# Patient Record
Sex: Female | Born: 1945 | Race: Black or African American | Hispanic: No | Marital: Married | Smoking: Never smoker
Health system: Southern US, Community
[De-identification: ages and names within clinical notes are randomized; demographics above are authoritative.]

## PROBLEM LIST (undated history)

## (undated) DIAGNOSIS — K219 Gastro-esophageal reflux disease without esophagitis: Secondary | ICD-10-CM

## (undated) DIAGNOSIS — E785 Hyperlipidemia, unspecified: Secondary | ICD-10-CM

## (undated) DIAGNOSIS — R5383 Other fatigue: Secondary | ICD-10-CM

## (undated) DIAGNOSIS — D72820 Lymphocytosis (symptomatic): Secondary | ICD-10-CM

## (undated) DIAGNOSIS — I8390 Asymptomatic varicose veins of unspecified lower extremity: Secondary | ICD-10-CM

## (undated) DIAGNOSIS — M542 Cervicalgia: Secondary | ICD-10-CM

## (undated) DIAGNOSIS — M1990 Unspecified osteoarthritis, unspecified site: Secondary | ICD-10-CM

## (undated) HISTORY — DX: Cervicalgia: M54.2

## (undated) HISTORY — DX: Unspecified osteoarthritis, unspecified site: M19.90

## (undated) HISTORY — DX: Other fatigue: R53.83

## (undated) HISTORY — DX: Gastro-esophageal reflux disease without esophagitis: K21.9

## (undated) HISTORY — PX: EYE SURGERY: SHX253

## (undated) HISTORY — PX: ELBOW SURGERY: SHX618

## (undated) HISTORY — PX: TONSILLECTOMY: SUR1361

## (undated) HISTORY — DX: Hyperlipidemia, unspecified: E78.5

## (undated) HISTORY — PX: ABDOMINAL HYSTERECTOMY: SHX81

## (undated) HISTORY — DX: Lymphocytosis (symptomatic): D72.820

## (undated) HISTORY — PX: CARPAL TUNNEL RELEASE: SHX101

## (undated) HISTORY — DX: Asymptomatic varicose veins of unspecified lower extremity: I83.90

---

## 2015-08-10 ENCOUNTER — Ambulatory Visit (INDEPENDENT_AMBULATORY_CARE_PROVIDER_SITE_OTHER): Payer: Medicare Other | Admitting: Neurology

## 2015-08-10 ENCOUNTER — Encounter: Payer: Self-pay | Admitting: Neurology

## 2015-08-10 VITALS — BP 156/71 | HR 68 | Ht 62.0 in | Wt 156.0 lb

## 2015-08-10 DIAGNOSIS — M542 Cervicalgia: Secondary | ICD-10-CM | POA: Diagnosis not present

## 2015-08-10 DIAGNOSIS — R251 Tremor, unspecified: Secondary | ICD-10-CM

## 2015-08-10 DIAGNOSIS — R202 Paresthesia of skin: Secondary | ICD-10-CM | POA: Diagnosis not present

## 2015-08-10 MED ORDER — GABAPENTIN 100 MG PO CAPS: 100.0000 mg | ORAL_CAPSULE | Freq: Three times a day (TID) | ORAL | Status: DC

## 2015-08-10 NOTE — Progress Notes (Signed)
PATIENT: Sarah Rowe DOB: [birth year]  Chief Complaint  Patient presents with  . Neck Pain    She is having pain and tingling in her neck that radiates into her right scapula, arm and hand.  She has tried muscle relaxers, gabapentin, narocotics and NSAIDS without much relief.  She remembers having an abnormal MRI several years ago that showed a "swollen disc pressing on a nerve".     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  In 2013, she also noticed gradual onset right-sided neck pain, radiating numbness tingling to right shoulder, right scapular region, also to her right arm, to her lateral 3 fingers, she denies significant weakness, symptoms gradually worsened over the past 3 years, she denies gait difficulty.  She had MRI of cervical spine December 2013, subtle spondylosis with very mild degenerative disc disease from C3-4 through C5-6, no disc herniation no canal stenosis or cord compression  She denied loss sense of smell, no family history of tremor, she denies REM sleep disorder, no vivid dreams, she denies orthostatic symptoms, she denies constipations. No gait difficulties.  REVIEW OF SYSTEMS: Full 14 system review of systems performed and notable only for as above  ALLERGIES: Allergies  Allergen Reactions  . Penicillins Rash    HOME MEDICATIONS: Current Outpatient Prescriptions  Medication Sig Dispense Refill  . cyclobenzaprine (FLEXERIL) 5 MG tablet Take 5 mg by mouth.    . Glucosamine-MSM-Hyaluronic Acd (JOINT HEALTH PO) Take  by mouth.    . Homeopathic Products ([employer] NERVE PAIN RELIEF EX) Apply topically.    Marland Kitchen ibuprofen (ADVIL) 200 MG tablet Take  1 or 2 every 6 hrs as  Needed for pain    . meloxicam (MOBIC) 7.5 MG tablet Take 7.5 mg by mouth daily.    . Multiple Vitamin (MULTI-VITAMINS) TABS Take by mouth.    . pyridOXINE (VITAMIN B-6) 100 MG tablet Take 100 mg by mouth daily.    Marland Kitchen UNABLE TO FIND Med Name: Cataplex B    . UNABLE TO FIND Med Name: [facility] Choice Nerve Pain Formula Supplement     No current facility-administered medications for this visit.    PAST MEDICAL HISTORY: Past Medical History  Diagnosis Date  . Neck pain   . Varicose veins   . Osteoarthritis   . GERD (gastroesophageal reflux disease)   . Hyperlipemia   . Lymphocytosis   . Fatigue     PAST SURGICAL HISTORY: Past Surgical History  Procedure Laterality Date  . Carpal tunnel release Bilateral   . Abdominal hysterectomy    . Elbow surgery Right   . Tonsillectomy      FAMILY HISTORY: Family History  Problem Relation Age of Onset  . Lung cancer Mother   . Heart disease Father   . Heart attack Father     SOCIAL HISTORY:  Social History   Social History  . Marital Status: Married    Spouse Name: N/A  . Number of Children: 2  . Years of Education: 11   Occupational History  . Retired    Social History Main Topics  .  Smoking status: Never Smoker   . Smokeless tobacco: Not on file  . Alcohol Use: No  . Drug Use: No  . Sexual Activity: Not on file   Other Topics Concern  . Not on file   Social History Narrative   Lives at home with her husband.   Right-handed.   No caffeine use.     PHYSICAL EXAM   Filed Vitals:   08/10/15 1027  BP: 156/71  Pulse: 68  Height: 5\' 2"  (1.575 m)  Weight: 156 lb (70.761 kg)    Not recorded      Body mass index is 28.53 kg/(m^2).  PHYSICAL EXAMNIATION:  Gen: NAD, conversant, well nourised, obese, well groomed                     Cardiovascular: Regular rate  rhythm, no peripheral edema, warm, nontender. Eyes: Conjunctivae clear without exudates or hemorrhage Neck: Supple, no carotid bruise. Pulmonary: Clear to auscultation bilaterally   NEUROLOGICAL EXAM:  MENTAL STATUS: Speech:    Speech is normal; fluent and spontaneous with normal comprehension.  Cognition:     Orientation to time, place and person     Normal recent and remote memory     Normal Attention span and concentration     Normal Language, naming, repeating,spontaneous speech     Fund of knowledge   CRANIAL NERVES: CN II: Visual fields are full to confrontation. Fundoscopic exam is normal with sharp discs and no vascular changes. Pupils are round equal and briskly reactive to light. CN III, IV, VI: extraocular movement are normal. No ptosis. CN V: Facial sensation is intact to pinprick in all 3 divisions bilaterally. Corneal responses are intact.  CN VII: Face is symmetric with normal eye closure and smile. CN VIII: Hearing is normal to rubbing fingers CN IX, X: Palate elevates symmetrically. Phonation is normal. CN XI: Head turning and shoulder shrug are intact CN XII: Tongue is midline with normal movements and no atrophy.  MOTOR: She has bilateral hand resting tremor, right worse than left, mild rigidity increased with reinforcement maneuver, right worse than left, also has mild no rigidity, mild bilateral upper extremity bradykinesia, no weakness  REFLEXES: Reflexes are 2+ and symmetric at the biceps, triceps, knees, and ankles. Plantar responses are flexor.  SENSORY: Intact to light touch, pinprick, position sense, and vibration sense are intact in fingers and toes.  COORDINATION: Rapid alternating movements and fine finger movements are intact. There is no dysmetria on finger-to-nose and heel-knee-shin.    GAIT/STANCE: Posture is normal. Gait is steady with normal steps, she has increased bilateral hands tremor, mildly decreased arm swing  bilaterally   DIAGNOSTIC DATA (LABS, IMAGING, TESTING) - I reviewed patient records, labs, notes, testing and imaging myself where available.   ASSESSMENT AND PLAN  Sarah Rowe is a 69 y.o. female   Right neck pain, radiating pain to right shoulder, right upper extremity  Need to rule out right cervical radiculopathy  MRI of cervical  EMG nerve conduction study  Gabapentin 100 mg 3 times a day, when necessary mobics  Bilateral hands tremor, mild parkinsonian features  Continue to observe her symptoms    Levert Feinstein, M.D. Ph.D.  [facility] [address] [place], [place] [address] Ph: [phone] Fax: [phone]  CC: Estell Harpin, Olegario Messier

## 2015-08-24 ENCOUNTER — Ambulatory Visit
Admission: RE | Admit: 2015-08-24 | Discharge: 2015-08-24 | Disposition: A | Discharge status: Home / Self Care | Payer: Medicare Other | Source: Ambulatory Visit | Attending: Neurology | Admitting: Neurology

## 2015-08-24 DIAGNOSIS — M542 Cervicalgia: Secondary | ICD-10-CM | POA: Diagnosis not present

## 2015-08-24 DIAGNOSIS — R251 Tremor, unspecified: Secondary | ICD-10-CM

## 2015-08-24 DIAGNOSIS — R202 Paresthesia of skin: Secondary | ICD-10-CM

## 2015-08-26 ENCOUNTER — Telehealth: Payer: Self-pay | Admitting: Neurology

## 2015-08-26 NOTE — Telephone Encounter (Signed)
Please call patient, MRI of cervical spine showed mild cervical degenerative disc disease, no significant canal or foraminal stenosis.  IMPRESSION: Abnormal MRI scan of the cervical spine showing mild disc degenerative changes from C3-C6 with moderate left C5-6 foraminal narrowing but without definite compression.

## 2015-08-26 NOTE — Telephone Encounter (Signed)
LMTC./fim 

## 2015-08-29 ENCOUNTER — Telehealth: Payer: Self-pay | Admitting: Neurology

## 2015-08-29 NOTE — Telephone Encounter (Signed)
Duplicate encounter/fim 

## 2015-08-29 NOTE — Telephone Encounter (Signed)
I have spoken with Sarah Rowe and per Dr. Terrace Arabia, have advsied that mri showed mild degen. changes, no sig. canal or foraminal stenosis.  I have advised that Dr. Terrace Arabia will discuss results in greater detail at f/u appt.  She verbalized understanding of same/fim

## 2015-08-29 NOTE — Telephone Encounter (Signed)
Patient is calling back abouth the results of her MRI test.  Thanks!

## 2015-08-30 ENCOUNTER — Ambulatory Visit (INDEPENDENT_AMBULATORY_CARE_PROVIDER_SITE_OTHER): Payer: Medicare Other | Admitting: Neurology

## 2015-08-30 ENCOUNTER — Encounter (INDEPENDENT_AMBULATORY_CARE_PROVIDER_SITE_OTHER): Payer: Self-pay | Admitting: Neurology

## 2015-08-30 DIAGNOSIS — G20 Parkinson's disease: Secondary | ICD-10-CM | POA: Diagnosis not present

## 2015-08-30 DIAGNOSIS — M542 Cervicalgia: Secondary | ICD-10-CM | POA: Diagnosis not present

## 2015-08-30 DIAGNOSIS — R202 Paresthesia of skin: Secondary | ICD-10-CM | POA: Diagnosis not present

## 2015-08-30 DIAGNOSIS — Z0289 Encounter for other administrative examinations: Secondary | ICD-10-CM

## 2015-08-30 DIAGNOSIS — G20C Parkinsonism, unspecified: Secondary | ICD-10-CM

## 2015-08-30 DIAGNOSIS — R251 Tremor, unspecified: Secondary | ICD-10-CM

## 2015-08-30 MED ORDER — CARBIDOPA-LEVODOPA 25-100 MG PO TABS: 1.0000 | ORAL_TABLET | Freq: Three times a day (TID) | ORAL | Status: DC

## 2015-08-30 NOTE — Progress Notes (Signed)
PATIENT: Sarah Rowe DOB: 28-Sep-1946  No chief complaint on file.    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  In 2013, she also noticed gradual onset right-sided neck pain, radiating numbness tingling to right shoulder, right scapular region, also to her right arm, to her lateral 3 fingers, she denies significant weakness, symptoms gradually worsened over the past 3 years, she denies gait difficulty.  She had MRI of cervical spine December 2013, subtle spondylosis with very mild degenerative disc disease from C3-4 through C5-6, no disc herniation no canal stenosis or cord compression  She denied loss sense of smell, no family history of tremor, she denies REM sleep disorder, no vivid dreams, she denies orthostatic symptoms, she denies constipations. No gait difficulties.  REVIEW OF SYSTEMS: Full 14 system review of systems performed and notable only for as above  ALLERGIES: Allergies  Allergen Reactions  . Penicillins Rash    HOME MEDICATIONS: Current Outpatient Prescriptions  Medication Sig Dispense Refill  . cyclobenzaprine (FLEXERIL) 5 MG tablet Take 5 mg by mouth.    . gabapentin (NEURONTIN) 100 MG capsule Take 1 capsule (100 mg total) by mouth 3 (three) times daily. 90 capsule 6  . Glucosamine-MSM-Hyaluronic Acd (JOINT HEALTH PO) Take by mouth.    . Homeopathic Products ([employer]S NERVE PAIN RELIEF EX) Apply topically.    Marland Kitchen ibuprofen (ADVIL) 200 MG tablet Take  1 or 2 every 6 hrs as  Needed for pain    . meloxicam (MOBIC) 7.5 MG tablet  Take 7.5 mg by mouth daily.    . Multiple Vitamin (MULTI-VITAMINS) TABS Take by mouth.    . pyridOXINE (VITAMIN B-6) 100 MG tablet Take 100 mg by mouth daily.    Marland Kitchen UNABLE TO FIND Med Name: Cataplex B    . UNABLE TO FIND Med Name: [facility] Choice Nerve Pain Formula Supplement     No current facility-administered medications for this visit.    PAST MEDICAL HISTORY: Past Medical History  Diagnosis Date  . Neck pain   . Varicose veins   . Osteoarthritis   . GERD (gastroesophageal reflux disease)   . Hyperlipemia   . Lymphocytosis   . Fatigue     PAST SURGICAL HISTORY: Past Surgical History  Procedure Laterality Date  . Carpal tunnel release Bilateral   . Abdominal hysterectomy    . Elbow surgery Right   . Tonsillectomy      FAMILY HISTORY: Family History  Problem Relation Age of Onset  . Lung cancer Mother   . Heart disease Father   . Heart attack Father     SOCIAL HISTORY:  Social History   Social History  . Marital Status: Married    Spouse Name: N/A  . Number of Children: 2  . Years of Education: 11   Occupational History  . Retired    Social History Main Topics  . Smoking status: Never Smoker   . Smokeless tobacco: Not on file  . Alcohol Use: No  . Drug Use: No  . Sexual Activity: Not on file   Other Topics Concern  .  Not on file   Social History Narrative   Lives at home with her husband.   Right-handed.   No caffeine use.     PHYSICAL EXAM   There were no vitals filed for this visit.  Not recorded      There is no weight on file to calculate BMI.  PHYSICAL EXAMNIATION:  Gen: NAD, conversant, well nourised, obese, well groomed                     Cardiovascular: Regular rate rhythm, no peripheral edema, warm, nontender. Eyes: Conjunctivae clear without exudates or hemorrhage Neck: Supple, no carotid bruise. Pulmonary: Clear to auscultation bilaterally   NEUROLOGICAL EXAM:  MENTAL STATUS: Speech:    Speech is normal; fluent and  spontaneous with normal comprehension.  Cognition:     Orientation to time, place and person     Normal recent and remote memory     Normal Attention span and concentration     Normal Language, naming, repeating,spontaneous speech     Fund of knowledge   CRANIAL NERVES: CN II: Visual fields are full to confrontation. Fundoscopic exam is normal with sharp discs and no vascular changes. Pupils are round equal and briskly reactive to light. CN III, IV, VI: extraocular movement are normal. No ptosis. CN V: Facial sensation is intact to pinprick in all 3 divisions bilaterally. Corneal responses are intact.  CN VII: Face is symmetric with normal eye closure and smile. CN VIII: Hearing is normal to rubbing fingers CN IX, X: Palate elevates symmetrically. Phonation is normal. CN XI: Head turning and shoulder shrug are intact CN XII: Tongue is midline with normal movements and no atrophy.  MOTOR: She has bilateral hand resting tremor, right worse than left, mild rigidity increased with reinforcement maneuver, right worse than left, also has mild no rigidity, mild bilateral upper extremity bradykinesia, no weakness  REFLEXES: Reflexes are 2+ and symmetric at the biceps, triceps, knees, and ankles. Plantar responses are flexor.  SENSORY: Intact to light touch, pinprick, position sense, and vibration sense are intact in fingers and toes.  COORDINATION: Rapid alternating movements and fine finger movements are intact. There is no dysmetria on finger-to-nose and heel-knee-shin.    GAIT/STANCE: Posture is normal. Gait is steady with normal steps, she has increased bilateral hands tremor, mildly decreased arm swing bilaterally   DIAGNOSTIC DATA (LABS, IMAGING, TESTING) - I reviewed patient records, labs, notes, testing and imaging myself where available.   ASSESSMENT AND PLAN  [name] is a 69 y.o. female   Right neck pain, radiating pain to right shoulder, right upper  extremity  Need to rule out right cervical radiculopathy  MRI of cervical  EMG nerve conduction study  Gabapentin 100 mg 3 times a day, when necessary mobics  Bilateral hands tremor, mild parkinsonian features  Continue to observe her symptoms    Levert Feinstein, M.D. Ph.D.  [facility] [address], [address] [place], [place] [address] Ph: [phone] Fax: [phone]  CC: Estell Harpin, Olegario Messier

## 2015-08-31 NOTE — Procedures (Signed)
   NCS (NERVE CONDUCTION STUDY) WITH EMG (ELECTROMYOGRAPHY) REPORT   STUDY DATE: August 31 2015 PATIENT NAME: Sarah Rowe DOB: [birth year] MRN: [id number]    TECHNOLOGIST: Gearldine Shown ELECTROMYOGRAPHER: Levert Feinstein M.D.  CLINICAL INFORMATION:  69 year old female, complains of right shoulder pain, radiating pain to right upper extremity  FINDINGS: NERVE CONDUCTION STUDY: Bilateral ulnar sensory and motor responses were normal. Bilateral median sensory responses showed mildly prolonged peak latency, with normal snap amplitude. Bilateral median motor responses showed mildly prolonged distal latency, was normal C map amplitude, conduction velocity, and F-wave latency.   NEEDLE ELECTROMYOGRAPHY: Selected needle examinations was performed at right upper extremity muscles, right cervical paraspinal muscles.  Needle examination of right pronator teres, extensor digitorum communis, triceps, biceps, first dorsal interossei was normal.  There was no spontaneous activity at right cervical paraspinal muscles, right C5-6 and 7.   IMPRESSION:  This is an abnormal study. 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 There is no evidence of right cervical radiculopathy.   INTERPRETING PHYSICIAN:   Levert Feinstein M.D. Ph.D. [facility] [address], [address] [place], [place] [address] [phone]

## 2015-09-05 ENCOUNTER — Telehealth: Payer: Self-pay | Admitting: Neurology

## 2015-09-05 NOTE — Telephone Encounter (Signed)
Patient called to advise carbidopa-levodopa (SINEMET IR) 25-100 MG tablet "makes legs and chest pain bad and seems to make Right arm tremor at night"

## 2015-09-05 NOTE — Telephone Encounter (Signed)
She is agreeable to the instructions below and will decrease dose then titrate back up slowly.  I told her to please call us back if she has further concerns or symptoms do not resolve.

## 2015-09-05 NOTE — Telephone Encounter (Signed)
Sarah Rowe, please advise her start Sinemet 25/100 mg half tablet 3 times a day, for 1 week, then gradually increase to 1 tablet 3 times a day

## 2015-09-05 NOTE — Telephone Encounter (Signed)
She just started taking Sinemet 25-100mg  three times daily.  She has been having pain in her chest and legs since starting this medication.  Also, reports bilateral hand tremors to be worse.  Says she has not started any other medications.

## 2015-09-16 ENCOUNTER — Ambulatory Visit
Admission: RE | Admit: 2015-09-16 | Discharge: 2015-09-16 | Disposition: A | Discharge status: Home / Self Care | Payer: Medicare Other | Source: Ambulatory Visit | Attending: Neurology | Admitting: Neurology

## 2015-09-16 DIAGNOSIS — R202 Paresthesia of skin: Secondary | ICD-10-CM | POA: Diagnosis not present

## 2015-09-16 DIAGNOSIS — G20 Parkinson's disease: Secondary | ICD-10-CM | POA: Diagnosis not present

## 2015-09-16 DIAGNOSIS — R251 Tremor, unspecified: Secondary | ICD-10-CM | POA: Diagnosis not present

## 2015-09-16 DIAGNOSIS — M542 Cervicalgia: Secondary | ICD-10-CM

## 2015-09-17 ENCOUNTER — Telehealth: Payer: Self-pay | Admitting: Neurology

## 2015-09-17 NOTE — Telephone Encounter (Signed)
Please call patient, MRI brain showed age related changes, no acute lesions.  IMPRESSION: This MRI of the brain without contrast shows the following: 1. Scattered foci in the deep subcortical white matter of both hemispheres. 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 The extent is mildly more than expected for age.  2. There is no significant atrophy noted. 3. There are no acute findings.

## 2015-09-19 NOTE — Telephone Encounter (Signed)
Spoke to patient she is aware of results

## 2015-10-03 ENCOUNTER — Ambulatory Visit (INDEPENDENT_AMBULATORY_CARE_PROVIDER_SITE_OTHER): Payer: Medicare Other | Admitting: Neurology

## 2015-10-03 ENCOUNTER — Encounter: Payer: Self-pay | Admitting: Neurology

## 2015-10-03 VITALS — BP 117/74 | HR 69 | Ht 62.0 in | Wt 158.0 lb

## 2015-10-03 DIAGNOSIS — R251 Tremor, unspecified: Secondary | ICD-10-CM | POA: Diagnosis not present

## 2015-10-03 DIAGNOSIS — G20 Parkinson's disease: Secondary | ICD-10-CM | POA: Diagnosis not present

## 2015-10-03 DIAGNOSIS — M542 Cervicalgia: Secondary | ICD-10-CM | POA: Diagnosis not present

## 2015-10-03 NOTE — Progress Notes (Signed)
Chief Complaint  Patient presents with  . Neck Pain    She would like to review her MRI and EMG/NCV results.  She stopped taking gabapentin because it made her sleepy and dizzy.  She has not needed the meloxicam recently.  . Tremors    Still has a mild tremor.  She felt Sinemet made her symptoms worse and stopped the medication.      PATIENT: Sarah Rowe DOB: [birth year]  Chief Complaint  Patient presents with  . Neck Pain    She would like to review her MRI and EMG/NCV results.  She stopped taking gabapentin because it made her sleepy and dizzy.  She has not needed the meloxicam recently.  . Tremors    Still has a mild tremor.  She felt Sinemet made her symptoms worse and stopped the medication.     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  In 2013, she also noticed gradual onset right-sided neck pain, radiating numbness tingling to right shoulder, right scapular region, also to her right arm, to her lateral 3 fingers, she denies significant weakness, symptoms gradually worsened over the past 3 years, she denies gait difficulty.  She had MRI of cervical spine December 2013, subtle spondylosis with very mild degenerative disc disease from C3-4 through C5-6, no disc herniation no canal stenosis or cord compression  She denied loss sense of smell, no family history of tremor, she denies REM sleep disorder, no vivid dreams, she denies orthostatic symptoms, she denies constipations. No gait difficulties.  UPDATE Oct 03 2015: She has tried Sinemet  25/100 mg up to 1 tablet 3 times a day, she complains of arm and legs achy pain, increased tremor, has stopped taking the medications She now complains of both hand, arm tremor, she denies gait difficulty. She denies memory trouble  She still has mild midline neck pain, radiating to both shoulder,   We have personally reviewed MRI films in 2016 MRI of the brain mild small vessel disease MRI of cervical spine, multilevels degenerative disc disease, no significant canal or foraminal stenosis.   REVIEW OF SYSTEMS: Full 14 system review of systems performed and notable only for as above  ALLERGIES: Allergies  Allergen Reactions  . Penicillins Rash    HOME MEDICATIONS: Current Outpatient Prescriptions  Medication Sig Dispense Refill  . Glucosamine-MSM-Hyaluronic Acd (JOINT HEALTH PO) Take by mouth.    . Homeopathic Products ([employer] NERVE PAIN RELIEF EX) Apply topically.    Marland Kitchen ibuprofen (ADVIL) 200 MG tablet Take  1 or 2 every 6 hrs as  Needed for pain    . meloxicam (MOBIC) 7.5 MG tablet Take 7.5 mg by mouth daily.    . Multiple Vitamin (MULTI-VITAMINS) TABS Take by mouth.    . pyridOXINE (VITAMIN B-6) 100 MG tablet Take 100 mg by mouth daily.    Marland Kitchen UNABLE TO FIND Med Name: Cataplex B    . UNABLE TO FIND Med Name: [facility] Choice Nerve Pain Formula Supplement     No current facility-administered medications for this visit.    PAST MEDICAL HISTORY: Past Medical History  Diagnosis Date  . Neck pain   . Varicose veins   . Osteoarthritis   . GERD (gastroesophageal reflux disease)   . Hyperlipemia   . Lymphocytosis   . Fatigue     PAST SURGICAL HISTORY: Past Surgical History  Procedure Laterality Date  . Carpal tunnel release Bilateral   . Abdominal hysterectomy    . Elbow surgery Right   . Tonsillectomy      FAMILY HISTORY: Family History  Problem Relation Age of Onset  . Lung cancer Mother   . Heart disease Father   . Heart attack Father     SOCIAL HISTORY:  Social  History   Social History  . Marital Status: Married    Spouse Name: N/A  . Number of Children: 2  . Years of Education: 11   Occupational History  . Retired    Social History Main Topics  . Smoking status: Never Smoker   . Smokeless tobacco: Not on file  . Alcohol Use: No  . Drug Use: No  . Sexual Activity: Not on file   Other Topics Concern  . Not on file   Social History Narrative   Lives at home with her husband.   Right-handed.   No caffeine use.     PHYSICAL EXAM   Filed Vitals:   10/03/15 1059  BP: 117/74  Pulse: 69  Height:  (1.575 m)  Weight: 158 lb (71.668 kg)    Not recorded      Body mass index is 28.89 kg/(m^2).  PHYSICAL EXAMNIATION:  Gen: NAD, conversant, well nourised, obese, well groomed                     Cardiovascular: Regular rate rhythm, no peripheral edema, warm, nontender. Eyes: Conjunctivae clear without exudates or hemorrhage Neck: Supple, no carotid bruise. Pulmonary: Clear to auscultation bilaterally   NEUROLOGICAL EXAM:  MENTAL STATUS: Speech:    Speech is normal; fluent and spontaneous with normal comprehension.  Cognition:     Orientation to time, place and person     Normal recent and remote memory     Normal Attention span and concentration     Normal Language, naming, repeating,spontaneous speech     Fund of knowledge   CRANIAL NERVES: CN II: Visual fields are full to confrontation. Fundoscopic exam is normal with sharp discs and no vascular changes. Pupils are round equal and briskly reactive to light. CN III, IV, VI: extraocular movement are normal. No ptosis. CN V: Facial sensation is intact to pinprick in all 3 divisions bilaterally. Corneal responses are intact.  CN VII: Face is symmetric with normal eye closure and smile. CN VIII: Hearing is normal to rubbing fingers CN IX, X: Palate elevates symmetrically. Phonation is normal. CN XI: Head turning and shoulder shrug are intact CN XII: Tongue is midline  with normal movements and no atrophy.  MOTOR: She has bilateral hand resting tremor, right worse than left, mild rigidity increased with reinforcement maneuver, right worse than left, also has mild no rigidity, mild bilateral upper extremity bradykinesia, no weakness  REFLEXES: Reflexes are 2+ and symmetric at the biceps, triceps, knees, and ankles. Plantar responses are flexor.  SENSORY: Intact to light touch, pinprick, position sense, and vibration sense are intact in fingers and toes.  COORDINATION: Rapid alternating movements and fine finger movements are intact. There is no dysmetria on finger-to-nose and heel-knee-shin.    GAIT/STANCE: Posture is normal. Gait is steady with normal steps, she has  increased bilateral hands tremor, mildly decreased arm swing bilaterally   DIAGNOSTIC DATA (LABS, IMAGING, TESTING) - I reviewed patient records, labs, notes, testing and imaging myself where available.   ASSESSMENT AND PLAN  Lara MulchBeatrice Novinger is a 69 y.o. female   Right neck pain, radiating pain to right shoulder, right upper extremity  No evidence of active cervical radiculopathy by MRI of cervical spine, EMG nerve conduction study  Continue moderate exercise  Bilateral hands tremor, mild parkinsonian features  She could not tolerate Sinemet 25/100 mg 3 times a day, has made her symptoms worse  I have suggested her moderate exercise    Levert FeinsteinYijun Wymon Swaney, M.D. Ph.D.  [facility] [address], [address] [place], [place] [address] Ph: [phone] Fax: [phone]  CC: Estell HarpinGarner Brown, Olegario MessierKathy

## 2015-11-09 ENCOUNTER — Ambulatory Visit: Payer: Medicare Other | Admitting: Neurology

## 2016-04-05 ENCOUNTER — Ambulatory Visit: Payer: Medicare Other | Admitting: Neurology

## 2016-04-05 ENCOUNTER — Telehealth: Payer: Self-pay | Admitting: *Deleted

## 2016-04-05 NOTE — Telephone Encounter (Signed)
Called at 11:15am to cancel 11:30am appt.

## 2016-04-05 NOTE — Telephone Encounter (Signed)
Called pt to r/s appt left voice mail, for pt to call the office.

## 2016-09-11 ENCOUNTER — Other Ambulatory Visit: Payer: Self-pay | Admitting: Neurology

## 2018-05-29 ENCOUNTER — Encounter (HOSPITAL_BASED_OUTPATIENT_CLINIC_OR_DEPARTMENT_OTHER): Payer: Self-pay | Admitting: *Deleted

## 2018-05-29 ENCOUNTER — Other Ambulatory Visit: Payer: Self-pay

## 2018-05-29 ENCOUNTER — Emergency Department (HOSPITAL_BASED_OUTPATIENT_CLINIC_OR_DEPARTMENT_OTHER)
Admission: EM | Admit: 2018-05-29 | Discharge: 2018-05-29 | Disposition: A | Discharge status: Home / Self Care | Payer: Medicare HMO | Attending: Emergency Medicine | Admitting: Emergency Medicine

## 2018-05-29 DIAGNOSIS — Z79899 Other long term (current) drug therapy: Secondary | ICD-10-CM | POA: Diagnosis not present

## 2018-05-29 DIAGNOSIS — T63461A Toxic effect of venom of wasps, accidental (unintentional), initial encounter: Secondary | ICD-10-CM | POA: Insufficient documentation

## 2018-05-29 DIAGNOSIS — M79661 Pain in right lower leg: Secondary | ICD-10-CM | POA: Diagnosis present

## 2018-05-29 DIAGNOSIS — W57XXXA Bitten or stung by nonvenomous insect and other nonvenomous arthropods, initial encounter: Secondary | ICD-10-CM

## 2018-05-29 MED ORDER — ACETAMINOPHEN 500 MG PO TABS: 1000.0000 mg | ORAL_TABLET | Freq: Four times a day (QID) | ORAL | 0 refills | Status: AC | PRN

## 2018-05-29 MED ORDER — DOXYCYCLINE HYCLATE 100 MG PO CAPS: 100.0000 mg | ORAL_CAPSULE | Freq: Two times a day (BID) | ORAL | 0 refills | Status: AC

## 2018-05-29 MED ORDER — DOXYCYCLINE HYCLATE 100 MG PO TABS
100.0000 mg | ORAL_TABLET | Freq: Once | ORAL | Status: AC
  Administered 2018-05-29: 100 mg via ORAL
  Filled 2018-05-29: qty 1

## 2018-05-29 NOTE — ED Triage Notes (Signed)
Wasp sting to her right lower leg 2 days ago. Her leg is painful with redness and itching.

## 2018-05-29 NOTE — Discharge Instructions (Signed)
1.  You have been prescribed an antibiotic for an infected wasp sting.  Infection is suspected based on increased redness and swelling.  It is however also possible to get a large amount of swelling and redness from the staying alone without infection.  Keep the leg elevated on pillows and apply well wrapped ice pack for 20 minutes at a time every 2 hours for the next 2 days.  Take extra strength Tylenol every 6 hours as needed for pain. 2.  Make an appointment to see your doctor for recheck in 2 days. 3.  Return to the emergency department if you are getting worsening swelling, pain, fever or feeling generally ill.

## 2018-05-29 NOTE — ED Notes (Signed)
ED Provider at bedside. 

## 2018-05-29 NOTE — ED Provider Notes (Signed)
[facility] EMERGENCY DEPARTMENT Provider Note   CSN: [id number] Arrival date & time: 05/29/18  1952     History   Chief Complaint Chief Complaint  Patient presents with  . Insect Bite    HPI Sarah Rowe is a 72 y.o. female.  HPI Patient reports he got stung by wasp on her right lower leg 2 days ago.  Her husband reports that he did find a wasp nest right on the chair where she got stung.  Since this evening, the swelling and redness has increased.  She reports is become painful with walking.  She has not been pursuing any particular treatment.  No elevating no icing no acetaminophen.  Patient denies any associated fever, chills, constitutional symptoms. Past Medical History:  Diagnosis Date  . Fatigue   . GERD (gastroesophageal reflux disease)   . Hyperlipemia   . Lymphocytosis   . Neck pain   . Osteoarthritis   . Varicose veins     Patient Active Problem List   Diagnosis Date Noted  . Parkinsonism (HCC) 10/03/2015  . Tremor 08/30/2015  . Neck pain on right side 08/30/2015    Past Surgical History:  Procedure Laterality Date  . ABDOMINAL HYSTERECTOMY    . CARPAL TUNNEL RELEASE Bilateral   . ELBOW SURGERY Right   . TONSILLECTOMY       OB History   None      Home Medications    Prior to Admission medications   Medication Sig Start Date End Date Taking? Authorizing Provider  acetaminophen (TYLENOL) 500 MG tablet Take 2 tablets (1,000 mg total) by mouth every 6 (six) hours as needed. 05/29/18   Arby Barrette, MD  doxycycline (VIBRAMYCIN) 100 MG capsule Take 1 capsule (100 mg total) by mouth 2 (two) times daily. One po bid x 7 days 05/29/18   Arby Barrette, MD  Glucosamine-MSM-Hyaluronic Acd (JOINT HEALTH PO) Take by mouth.    [provider]  Homeopathic Products ([employer] NERVE PAIN RELIEF EX) Apply topically.    [provider]  ibuprofen (ADVIL) 200 MG tablet Take  1 or 2 every 6 hrs as  Needed for pain 03/15/15   [provider]  meloxicam (MOBIC) 7.5 MG tablet Take 7.5 mg by mouth daily.    [provider]  Multiple Vitamin (MULTI-VITAMINS) TABS Take by mouth.    [provider]  pyridOXINE (VITAMIN B-6) 100 MG tablet Take 100 mg by mouth daily.    [provider]  UNABLE TO FIND Med Name: Cataplex B    [provider]  UNABLE TO FIND Med Name: [facility] Choice Nerve Pain Formula Supplement    [provider]    Family History Family History  Problem Relation Age of Onset  . Heart disease Father   . Heart attack Father   . Lung cancer Mother     Social History Social History   Tobacco Use  . Smoking status: Never Smoker  . Smokeless tobacco: Never Used  Substance Use Topics  . Alcohol use: No    Alcohol/week: 0.0 oz  . Drug use: No     Allergies   Penicillins   Review of Systems Review of Systems  10 Systems reviewed and are negative for acute change except as noted in the HPI.  Physical Exam Updated Vital Signs BP (!) 160/65   Pulse 80   Temp 97.7 F (36.5 C) (Oral)   Resp 20   Ht 5\' 2"  (1.575 m)   Wt  64.9 kg (143 lb)   SpO2 100%   BMI 26.16 kg/m   Physical Exam  Constitutional:  Patient is alert and nontoxic.  She has evident tremor consistent with her pre-existing diagnosis of Parkinson's.  No respiratory distress.  HENT:  Head: Normocephalic and atraumatic.  No facial swelling.  Cardiovascular: Normal rate and regular rhythm.  Pulmonary/Chest: Effort normal and breath sounds normal.  Musculoskeletal:  Swelling and erythema of the lateral right lower leg.  Small central eschar consistent with a bite site.  See attached images.  Neurological: She is alert.  Patient is alert and interactive.  She has slight tremor consistent with Parkinson's.  She follows commands without difficulty.  Skin: Skin is warm and dry.           ED Treatments / Results  Labs (all labs ordered are listed, but only abnormal results  are displayed) Labs Reviewed - No data to display  EKG None  Radiology No results found.  Procedures Procedures (including critical care time)  Medications Ordered in ED Medications  doxycycline (VIBRA-TABS) tablet 100 mg (has no administration in time range)     Initial Impression / Assessment and Plan / ED Course  I have reviewed the triage vital signs and the nursing notes.  Pertinent labs & imaging results that were available during my care of the patient were reviewed by me and considered in my medical decision making (see chart for details).     Final Clinical Impressions(s) / ED Diagnoses   Final diagnoses:  Bug bite with infection, initial encounter   Patient has confirmed sting from a wasp.  Pain and erythema have increased over 2-day duration.  Will opt to treat empirically with doxycycline for potential secondary infection.  Due to large amount of inflammatory response often associated with wasp sting, patient is counseled on icing and elevating.  Return precautions reviewed. ED Discharge Orders        Ordered    doxycycline (VIBRAMYCIN) 100 MG capsule  2 times daily     05/29/18 2027    acetaminophen (TYLENOL) 500 MG tablet  Every 6 hours PRN     05/29/18 2027       Arby BarrettePfeiffer, Madicyn Mesina, MD 05/29/18 2035

## 2018-09-16 ENCOUNTER — Other Ambulatory Visit (HOSPITAL_BASED_OUTPATIENT_CLINIC_OR_DEPARTMENT_OTHER): Payer: Self-pay | Admitting: Physician Assistant

## 2018-09-16 ENCOUNTER — Ambulatory Visit (HOSPITAL_BASED_OUTPATIENT_CLINIC_OR_DEPARTMENT_OTHER)
Admission: RE | Admit: 2018-09-16 | Discharge: 2018-09-16 | Disposition: A | Discharge status: Home / Self Care | Payer: Medicare HMO | Source: Ambulatory Visit | Attending: Physician Assistant | Admitting: Physician Assistant

## 2018-09-16 DIAGNOSIS — M79604 Pain in right leg: Secondary | ICD-10-CM | POA: Diagnosis not present

## 2018-09-20 ENCOUNTER — Ambulatory Visit (HOSPITAL_BASED_OUTPATIENT_CLINIC_OR_DEPARTMENT_OTHER): Payer: Medicare HMO

## 2019-06-11 IMAGING — US US EXTREM LOW VENOUS*R*
1 series · 13 of 24 positions shown · non-contrast
Comparison: None.

CLINICAL DATA: Intermittent pain, swelling in right knee.



[Series 1: us extrem low venous*right* · 0.08mm/px · 13 of 33 slices shown]
[im 1/33]
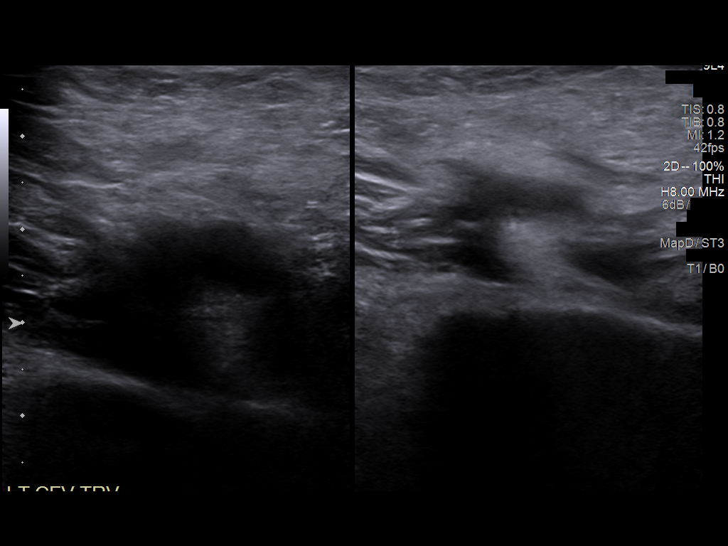
[im 3/33]
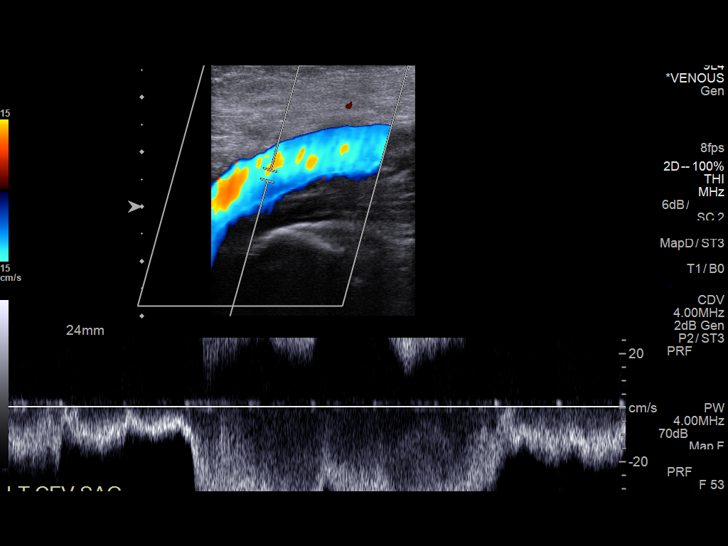
[im 6/33]
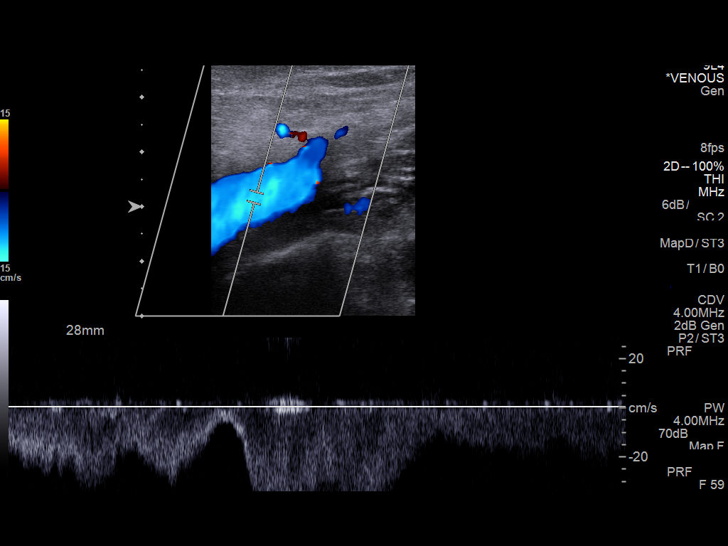
[im 9/33]
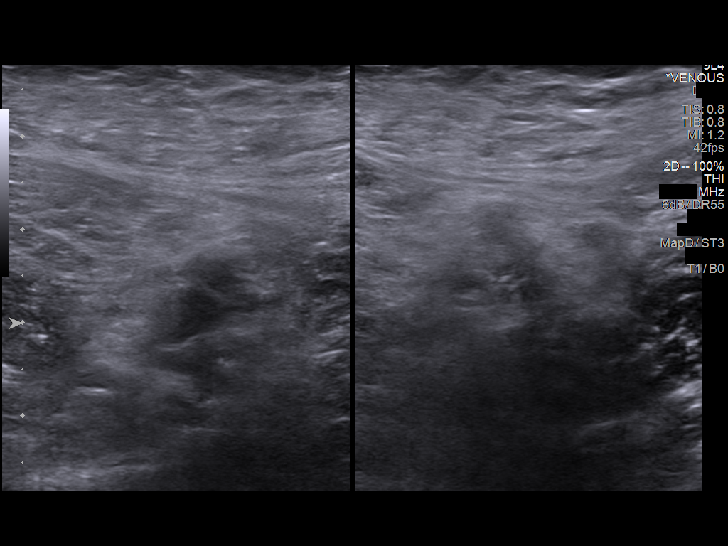
[im 12/33]
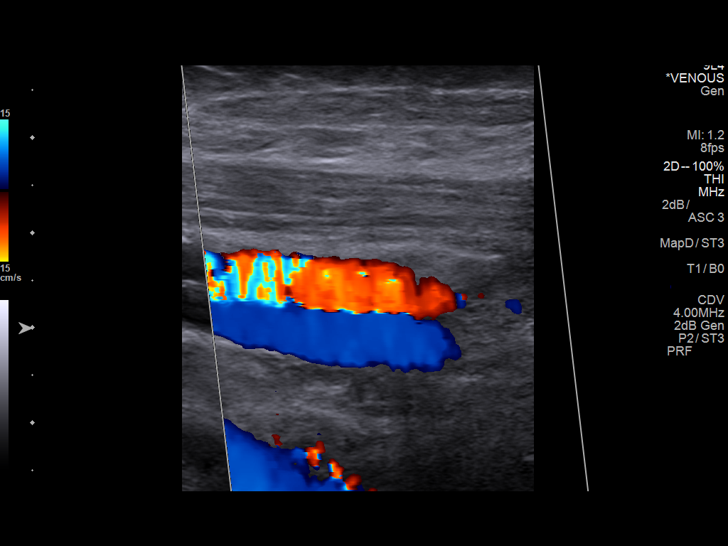
[im 14/33]
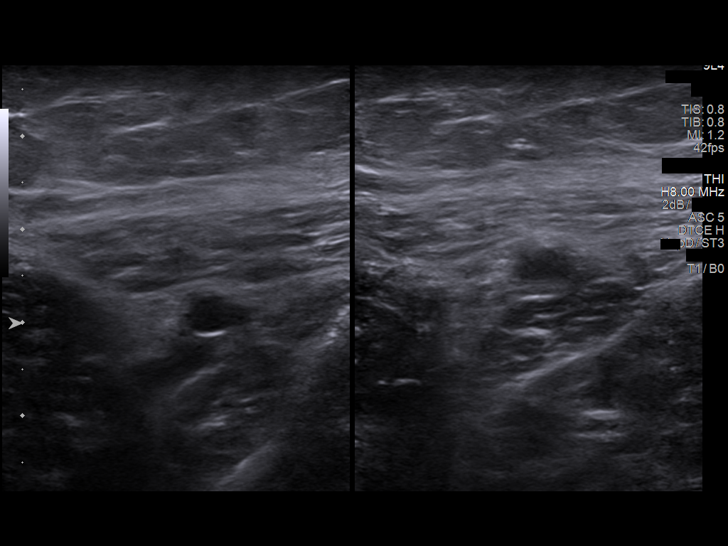
[im 17/33]
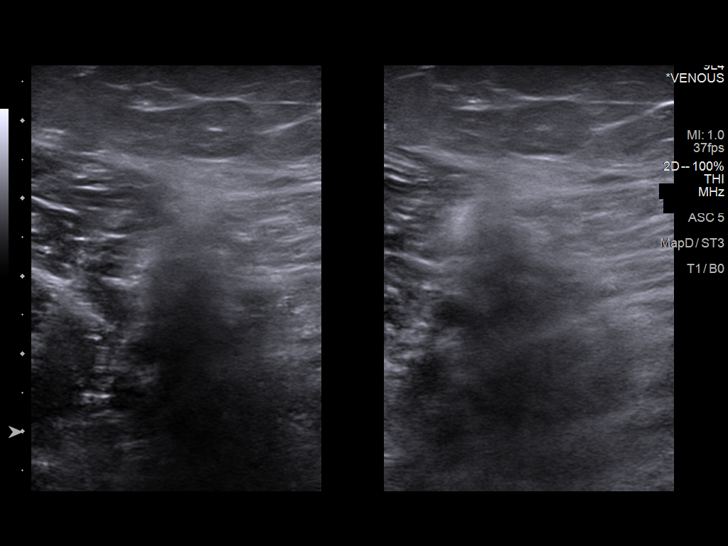
[im 19/33]
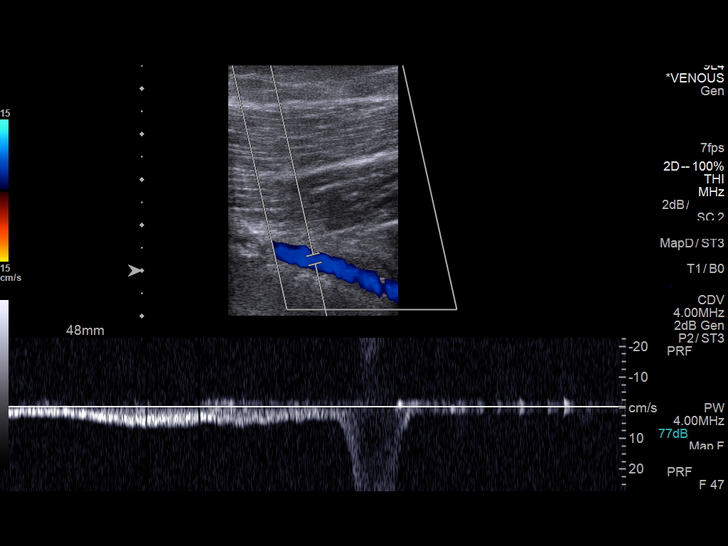
[im 21/33]
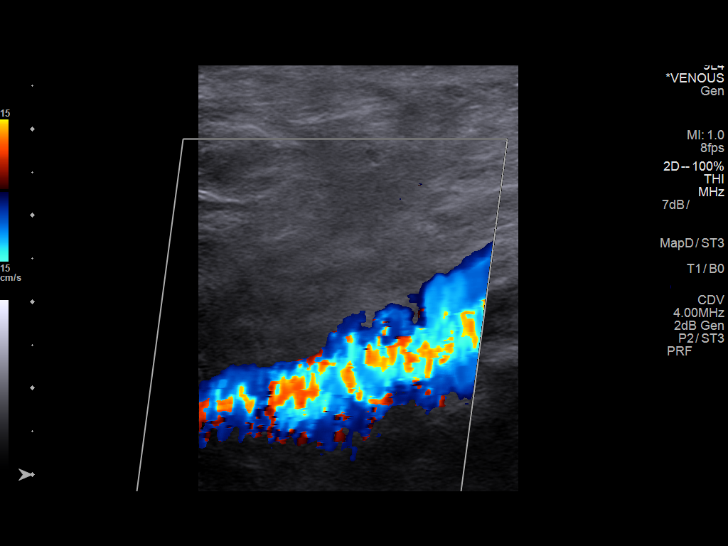
[im 24/33]
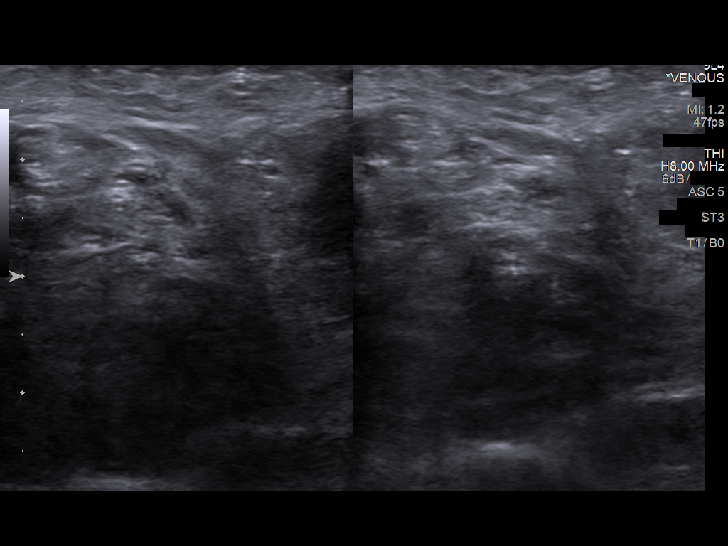
[im 27/33]
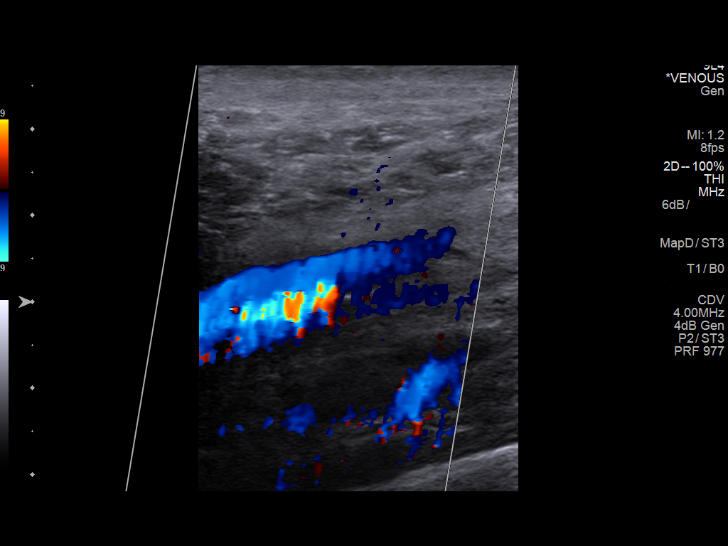
[im 30/33]
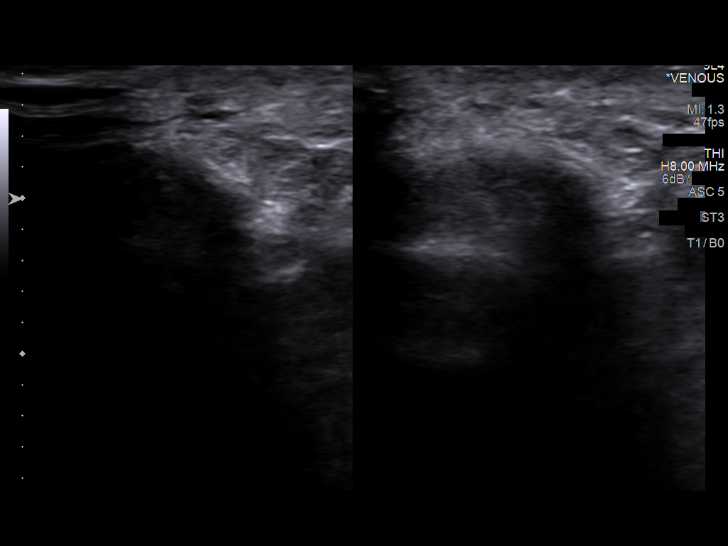
[im 33/33]
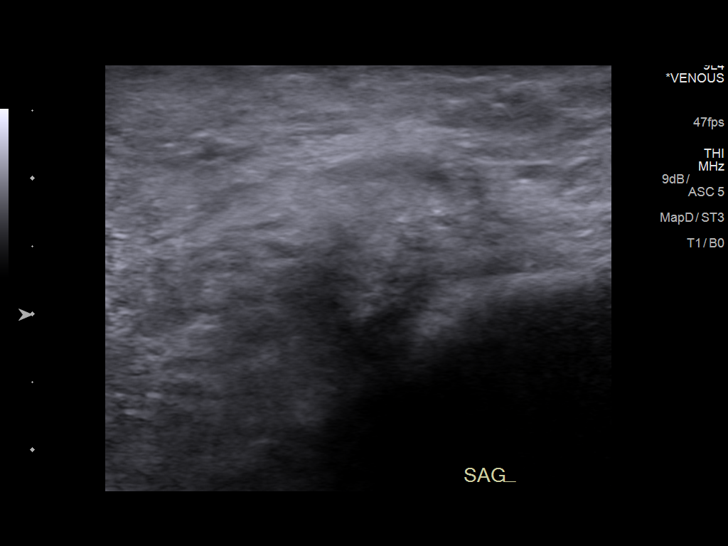

[13 of 24 positions shown; findings below may reference images not displayed]

FINDINGS: Contralateral Common Femoral Vein: Respiratory phasicity is normal
and symmetric with the symptomatic side. No evidence of thrombus.
Normal compressibility.

Common Femoral Vein: No evidence of thrombus. Normal
compressibility, respiratory phasicity and response to augmentation.

Saphenofemoral Junction: No evidence of thrombus. Normal
compressibility and flow on color Doppler imaging.

Profunda Femoral Vein: No evidence of thrombus. Normal
compressibility and flow on color Doppler imaging.

Femoral Vein: No evidence of thrombus. Normal compressibility,
respiratory phasicity and response to augmentation.

Popliteal Vein: No evidence of thrombus. Normal compressibility,
respiratory phasicity and response to augmentation.

Calf Veins: No evidence of thrombus. Normal compressibility and flow
on color Doppler imaging.

Superficial Great Saphenous Vein: No evidence of thrombus. Normal
compressibility.

Venous Reflux:  None.

Other Findings:  Probable joint effusion within the right knee.
IMPRESSION: No evidence of deep venous thrombosis.

Probable right knee joint effusion.

## 2023-10-20 ENCOUNTER — Emergency Department (HOSPITAL_BASED_OUTPATIENT_CLINIC_OR_DEPARTMENT_OTHER)
Admission: EM | Admit: 2023-10-20 | Discharge: 2023-10-20 | Disposition: A | Discharge status: Home / Self Care | Payer: Medicare HMO | Attending: Emergency Medicine | Admitting: Emergency Medicine

## 2023-10-20 ENCOUNTER — Emergency Department (HOSPITAL_BASED_OUTPATIENT_CLINIC_OR_DEPARTMENT_OTHER): Payer: Medicare HMO

## 2023-10-20 ENCOUNTER — Encounter (HOSPITAL_BASED_OUTPATIENT_CLINIC_OR_DEPARTMENT_OTHER): Payer: Self-pay | Admitting: Emergency Medicine

## 2023-10-20 ENCOUNTER — Other Ambulatory Visit: Payer: Self-pay

## 2023-10-20 DIAGNOSIS — R0602 Shortness of breath: Secondary | ICD-10-CM | POA: Insufficient documentation

## 2023-10-20 DIAGNOSIS — B974 Respiratory syncytial virus as the cause of diseases classified elsewhere: Secondary | ICD-10-CM | POA: Diagnosis not present

## 2023-10-20 DIAGNOSIS — R519 Headache, unspecified: Secondary | ICD-10-CM | POA: Diagnosis not present

## 2023-10-20 DIAGNOSIS — R1030 Lower abdominal pain, unspecified: Secondary | ICD-10-CM | POA: Diagnosis not present

## 2023-10-20 DIAGNOSIS — Z1152 Encounter for screening for COVID-19: Secondary | ICD-10-CM | POA: Insufficient documentation

## 2023-10-20 DIAGNOSIS — B338 Other specified viral diseases: Secondary | ICD-10-CM

## 2023-10-20 DIAGNOSIS — R059 Cough, unspecified: Secondary | ICD-10-CM | POA: Diagnosis present

## 2023-10-20 DIAGNOSIS — R531 Weakness: Secondary | ICD-10-CM | POA: Diagnosis not present

## 2023-10-20 LAB — RESP PANEL BY RT-PCR (RSV, FLU A&B, COVID)  RVPGX2
Influenza A by PCR: NEGATIVE
Influenza B by PCR: NEGATIVE
Resp Syncytial Virus by PCR: POSITIVE — AB
SARS Coronavirus 2 by RT PCR: NEGATIVE

## 2023-10-20 LAB — URINALYSIS, W/ REFLEX TO CULTURE (INFECTION SUSPECTED)
Bilirubin Urine: NEGATIVE
Glucose, UA: NEGATIVE mg/dL
Ketones, ur: NEGATIVE mg/dL
Nitrite: NEGATIVE
Protein, ur: NEGATIVE mg/dL
Specific Gravity, Urine: 1.01 (ref 1.005–1.030)
pH: 6 (ref 5.0–8.0)

## 2023-10-20 LAB — COMPREHENSIVE METABOLIC PANEL
ALT: 41 U/L (ref 0–44)
AST: 33 U/L (ref 15–41)
Albumin: 4.1 g/dL (ref 3.5–5.0)
Alkaline Phosphatase: 120 U/L (ref 38–126)
Anion gap: 8 (ref 5–15)
BUN: 21 mg/dL (ref 8–23)
CO2: 25 mmol/L (ref 22–32)
Calcium: 9.3 mg/dL (ref 8.9–10.3)
Chloride: 106 mmol/L (ref 98–111)
Creatinine, Ser: 1.12 mg/dL — ABNORMAL HIGH (ref 0.44–1.00)
GFR, Estimated: 51 mL/min — ABNORMAL LOW (ref >60)
Glucose, Bld: 104 mg/dL — ABNORMAL HIGH (ref 70–99)
Potassium: 4.2 mmol/L (ref 3.5–5.1)
Sodium: 139 mmol/L (ref 135–145)
Total Bilirubin: 0.7 mg/dL (ref <1.2)
Total Protein: 7.3 g/dL (ref 6.5–8.1)

## 2023-10-20 LAB — CBC WITH DIFFERENTIAL/PLATELET
Abs Immature Granulocytes: 0.01 10*3/uL (ref 0.00–0.07)
Basophils Absolute: 0 10*3/uL (ref 0.0–0.1)
Basophils Relative: 0 %
Eosinophils Absolute: 0 10*3/uL (ref 0.0–0.5)
Eosinophils Relative: 1 %
HCT: 35.8 % — ABNORMAL LOW (ref 36.0–46.0)
Hemoglobin: 11.6 g/dL — ABNORMAL LOW (ref 12.0–15.0)
Immature Granulocytes: 0 %
Lymphocytes Relative: 46 %
Lymphs Abs: 1.3 10*3/uL (ref 0.7–4.0)
MCH: 30 pg (ref 26.0–34.0)
MCHC: 32.4 g/dL (ref 30.0–36.0)
MCV: 92.5 fL (ref 80.0–100.0)
Monocytes Absolute: 0.4 10*3/uL (ref 0.1–1.0)
Monocytes Relative: 14 %
Neutro Abs: 1.1 10*3/uL — ABNORMAL LOW (ref 1.7–7.7)
Neutrophils Relative %: 39 %
Platelets: 121 10*3/uL — ABNORMAL LOW (ref 150–400)
RBC: 3.87 MIL/uL (ref 3.87–5.11)
RDW: 13.2 % (ref 11.5–15.5)
WBC: 2.8 10*3/uL — ABNORMAL LOW (ref 4.0–10.5)
nRBC: 0 % (ref 0.0–0.2)

## 2023-10-20 LAB — TROPONIN I (HIGH SENSITIVITY): Troponin I (High Sensitivity): 11 ng/L (ref <18)

## 2023-10-20 MED ORDER — SODIUM CHLORIDE 0.9 % IV BOLUS
500.0000 mL | Freq: Once | INTRAVENOUS | Status: AC
  Administered 2023-10-20: 500 mL via INTRAVENOUS

## 2023-10-20 MED ORDER — ACETAMINOPHEN 500 MG PO TABS
1000.0000 mg | ORAL_TABLET | Freq: Once | ORAL | Status: AC
  Administered 2023-10-20: 1000 mg via ORAL
  Filled 2023-10-20: qty 2

## 2023-10-20 MED ORDER — BENZONATATE 100 MG PO CAPS: 100.0000 mg | ORAL_CAPSULE | Freq: Three times a day (TID) | ORAL | 0 refills | Status: AC | PRN

## 2023-10-20 NOTE — ED Triage Notes (Signed)
Pt reports cough and congestion since Thursday. Husband was sick with "bad cold" prior to her onset. She reports worsening cough, generalized weakness, tight feeling in her chest (started last night) and intermittent SHOB. She denies SHOB at present. Rates chest tightness 6/10, pinpoints it mid chest/right chest, states it is "mainly when I cough". Pt is afebrile on arrival. Has baseline tremor BIL UE. Reports using Flonase and OTC meds to treat her sx.

## 2023-10-20 NOTE — ED Provider Notes (Signed)
Emergency Department Provider Note   I have reviewed the triage vital signs and the nursing notes.   HISTORY  Chief Complaint Cough   HPI Sarah Rowe is a 77 y.o. female past history reviewed below presents to the emergency department with cough, congestion, headache, generalized weakness.  She has had worsening symptoms over the past several days.  Her husband was sick with a "bad cold" in the last week and she thinks she may have picked that up.  She does have some discomfort in her chest that is worse with coughing.  No exertional chest pain or pain at rest.  Also describing some mild lower abdominal discomfort but denies dysuria, hesitancy, urgency.  She has been trying Flonase without relief.   Past Medical History:  Diagnosis Date   Fatigue    GERD (gastroesophageal reflux disease)    Hyperlipemia    Lymphocytosis    Neck pain    Osteoarthritis    Varicose veins     Review of Systems  Constitutional: No fever/chills. Positive fatigue.  ENT: No sore throat. Cardiovascular: Positive chest pain. Respiratory: Intermittent shortness of breath and cough.  Gastrointestinal: Mild lower abdominal pain.  No nausea, no vomiting.  No diarrhea.  No constipation. Genitourinary: Negative for dysuria. Musculoskeletal: Negative for back pain. Skin: Negative for rash. Neurological: Positive HA.   ____________________________________________   PHYSICAL EXAM:  VITAL SIGNS: ED Triage Vitals  Encounter Vitals Group     BP 10/20/23 0445 (!) 162/81     Pulse Rate 10/20/23 0453 83     Resp 10/20/23 0445 (!) 22     Temp 10/20/23 0445 98.9 F (37.2 C)     Temp Source 10/20/23 0445 Oral     SpO2 10/20/23 0445 94 %     Weight 10/20/23 0446 122 lb (55.3 kg)   Constitutional: Alert and oriented. Well appearing and in no acute distress. Eyes: Conjunctivae are normal. Head: Atraumatic. Nose: No congestion/rhinnorhea. Mouth/Throat: Mucous membranes are moist.  Neck: No  stridor.  Cardiovascular: Normal rate, regular rhythm. Good peripheral circulation. Grossly normal heart sounds.   Respiratory: Normal respiratory effort.  No retractions. Lungs CTAB. Gastrointestinal: Soft and nontender. No distention.  Musculoskeletal: No lower extremity tenderness nor edema. No gross deformities of extremities. Neurologic:  Normal speech and language. No gross focal neurologic deficits are appreciated.  Skin:  Skin is warm, dry and intact. No rash noted.  ____________________________________________   LABS (all labs ordered are listed, but only abnormal results are displayed)  Labs Reviewed  RESP PANEL BY RT-PCR (RSV, FLU A&B, COVID)  RVPGX2 - Abnormal; Notable for the following components:      Result Value   Resp Syncytial Virus by PCR POSITIVE (*)    All other components within normal limits  COMPREHENSIVE METABOLIC PANEL - Abnormal; Notable for the following components:   Glucose, Bld 104 (*)    Creatinine, Ser 1.12 (*)    GFR, Estimated 51 (*)    All other components within normal limits  CBC WITH DIFFERENTIAL/PLATELET - Abnormal; Notable for the following components:   WBC 2.8 (*)    Hemoglobin 11.6 (*)    HCT 35.8 (*)    Platelets 121 (*)    Neutro Abs 1.1 (*)    All other components within normal limits  URINALYSIS, W/ REFLEX TO CULTURE (INFECTION SUSPECTED) - Abnormal; Notable for the following components:   Hgb urine dipstick TRACE (*)    Leukocytes,Ua SMALL (*)    Bacteria, UA RARE (*)  All other components within normal limits  TROPONIN I (HIGH SENSITIVITY)   ____________________________________________  EKG   EKG Interpretation Date/Time:  Sunday October 20 2023 04:47:24 EST Ventricular Rate:  84 PR Interval:  160 QRS Duration:  87 QT Interval:  370 QTC Calculation: 438 R Axis:   69  Text Interpretation: Sinus rhythm Biatrial enlargement Low voltage, precordial leads Minimal ST elevation, lateral leads Patient with tremor leading  to artifact Confirmed by Alona Bene [phone]) on 10/20/2023 5:20:34 AM       ____________________________________________   PROCEDURES  Procedure(s) performed:   Procedures  None  ____________________________________________   INITIAL IMPRESSION / ASSESSMENT AND PLAN / ED COURSE  Pertinent labs & imaging results that were available during my care of the patient were reviewed by me and considered in my medical decision making (see chart for details).   This patient is Presenting for Evaluation of CP/cough, which does require a range of treatment options, and is a complaint that involves a high risk of morbidity and mortality.  The Differential Diagnoses  includes but is not exclusive to acute coronary syndrome, aortic dissection, pulmonary embolism, cardiac tamponade, community-acquired pneumonia, pericarditis, musculoskeletal chest wall pain, etc.   Critical Interventions-    Medications  acetaminophen (TYLENOL) tablet 1,000 mg (1,000 mg Oral Given 10/20/23 0459)  sodium chloride 0.9 % bolus 500 mL ( Intravenous Stopped 10/20/23 0623)    Reassessment after intervention:  symptoms improved.    I did obtain Additional Historical Information from husband at bedside.   Clinical Laboratory Tests Ordered, included CBC without leukocytosis. No AKI. Equivocal UA without infection symptoms. RSV positive.   Radiologic Tests Ordered, included CXR. I independently interpreted the images and agree with radiology interpretation.   Cardiac Monitor Tracing which shows NSR.    Social Determinants of Health Risk patient is a non-smoker.   Medical Decision Making: Summary:  Presents to the emergency department with symptoms most consistent with viral upper respiratory infection/bronchitis.  Pneumonia is a possibility but no fever.  No hypoxemia.  For viral panel, chest x-ray, screening lab work including EKG and troponin given her chest pain although the pain quality and character seem  most consistent with pain related to cough.  Mild lower abdominal discomfort.  Given patient's age and generalized weakness plan for UA and reassess after IV fluids and Tylenol.  Reevaluation with update and discussion with patient. She is feeling better after IVF. Plan for d/c with continued supportive care at home.   Considered admission but no hypoxemia. Patient appears well hydrated.   Patient's presentation is most consistent with acute presentation with potential threat to life or bodily function.   Disposition: discharge  ____________________________________________  FINAL CLINICAL IMPRESSION(S) / ED DIAGNOSES  Final diagnoses:  RSV infection     NEW OUTPATIENT MEDICATIONS STARTED DURING THIS VISIT:  Discharge Medication List as of 10/20/2023  6:45 AM     START taking these medications   Details  benzonatate (TESSALON) 100 MG capsule Take 1 capsule (100 mg total) by mouth 3 (three) times daily as needed for cough., Starting Sun 10/20/2023, Normal        Note:  This document was prepared using Dragon voice recognition software and may include unintentional dictation errors.  Alona Bene, MD, [facility]    Allycia Pitz, Arlyss Repress, MD 10/22/23 [phone]

## 2023-10-20 NOTE — Discharge Instructions (Signed)
You were seen today with cough and fount to have RSV. This will resolve with time. Please take Tylenol as needed for headache or fever by following the dosing instructions on the box.   Return to the ED if you develop any trouble breathing, worsening chest pain, or other sudden/severe symptoms.
